# Patient Record
Sex: Female | Born: 1975 | Race: Black or African American | Hispanic: No | Marital: Single | Smoking: Never smoker
Health system: Southern US, Community
[De-identification: ages and names within clinical notes are randomized; demographics above are authoritative.]

## PROBLEM LIST (undated history)

## (undated) ENCOUNTER — Inpatient Hospital Stay (HOSPITAL_COMMUNITY): Payer: Self-pay

## (undated) DIAGNOSIS — Z8619 Personal history of other infectious and parasitic diseases: Secondary | ICD-10-CM

## (undated) DIAGNOSIS — Z789 Other specified health status: Secondary | ICD-10-CM

## (undated) DIAGNOSIS — Z87898 Personal history of other specified conditions: Secondary | ICD-10-CM

## (undated) HISTORY — DX: Personal history of other specified conditions: Z87.898

## (undated) HISTORY — DX: Personal history of other infectious and parasitic diseases: Z86.19

## (undated) HISTORY — PX: NO PAST SURGERIES: SHX2092

---

## 1997-03-06 ENCOUNTER — Ambulatory Visit (HOSPITAL_COMMUNITY): Admission: RE | Admit: 1997-03-06 | Discharge: 1997-03-06 | Payer: Self-pay | Admitting: Obstetrics

## 1997-05-23 ENCOUNTER — Other Ambulatory Visit: Admission: RE | Admit: 1997-05-23 | Discharge: 1997-05-23 | Payer: Self-pay | Admitting: Obstetrics

## 1997-06-14 ENCOUNTER — Inpatient Hospital Stay (HOSPITAL_COMMUNITY): Admission: AD | Admit: 1997-06-14 | Discharge: 1997-06-16 | Payer: Self-pay | Admitting: Obstetrics

## 1997-06-29 ENCOUNTER — Inpatient Hospital Stay (HOSPITAL_COMMUNITY): Admission: AD | Admit: 1997-06-29 | Discharge: 1997-06-29 | Payer: Self-pay | Admitting: Obstetrics

## 2000-01-21 ENCOUNTER — Inpatient Hospital Stay (HOSPITAL_COMMUNITY): Admission: AD | Admit: 2000-01-21 | Discharge: 2000-01-21 | Payer: Self-pay | Admitting: Obstetrics

## 2000-01-29 ENCOUNTER — Other Ambulatory Visit: Admission: RE | Admit: 2000-01-29 | Discharge: 2000-01-29 | Payer: Self-pay | Admitting: Obstetrics

## 2000-05-14 ENCOUNTER — Ambulatory Visit (HOSPITAL_COMMUNITY): Admission: RE | Admit: 2000-05-14 | Discharge: 2000-05-14 | Payer: Self-pay | Admitting: Neurology

## 2000-06-28 ENCOUNTER — Inpatient Hospital Stay (HOSPITAL_COMMUNITY): Admission: AD | Admit: 2000-06-28 | Discharge: 2000-06-28 | Payer: Self-pay | Admitting: Obstetrics

## 2000-08-11 ENCOUNTER — Inpatient Hospital Stay (HOSPITAL_COMMUNITY): Admission: AD | Admit: 2000-08-11 | Discharge: 2000-08-13 | Payer: Self-pay | Admitting: Obstetrics

## 2001-01-13 HISTORY — PX: BUNIONECTOMY: SHX129

## 2001-04-27 ENCOUNTER — Emergency Department (HOSPITAL_COMMUNITY): Admission: EM | Admit: 2001-04-27 | Discharge: 2001-04-27 | Payer: Self-pay | Admitting: Emergency Medicine

## 2005-05-14 ENCOUNTER — Emergency Department (HOSPITAL_COMMUNITY): Admission: EM | Admit: 2005-05-14 | Discharge: 2005-05-14 | Payer: Self-pay | Admitting: Emergency Medicine

## 2008-08-26 ENCOUNTER — Inpatient Hospital Stay (HOSPITAL_COMMUNITY): Admission: AD | Admit: 2008-08-26 | Discharge: 2008-08-26 | Payer: Self-pay | Admitting: Obstetrics

## 2010-04-20 LAB — URINALYSIS, ROUTINE W REFLEX MICROSCOPIC: Glucose, UA: NEGATIVE mg/dL

## 2010-04-20 LAB — URINE MICROSCOPIC-ADD ON

## 2010-04-20 LAB — POCT PREGNANCY, URINE: Preg Test, Ur: NEGATIVE

## 2010-04-20 LAB — WET PREP, GENITAL: Yeast Wet Prep HPF POC: NONE SEEN

## 2010-07-23 ENCOUNTER — Other Ambulatory Visit (HOSPITAL_COMMUNITY): Payer: Self-pay | Admitting: Obstetrics

## 2010-07-23 DIAGNOSIS — Z3687 Encounter for antenatal screening for uncertain dates: Secondary | ICD-10-CM

## 2010-07-25 ENCOUNTER — Ambulatory Visit (HOSPITAL_COMMUNITY)
Admission: RE | Admit: 2010-07-25 | Discharge: 2010-07-25 | Disposition: A | Discharge status: Home / Self Care | Payer: BC Managed Care – PPO | Source: Ambulatory Visit | Attending: Obstetrics | Admitting: Obstetrics

## 2010-07-25 DIAGNOSIS — R109 Unspecified abdominal pain: Secondary | ICD-10-CM | POA: Insufficient documentation

## 2010-07-25 DIAGNOSIS — O209 Hemorrhage in early pregnancy, unspecified: Secondary | ICD-10-CM | POA: Insufficient documentation

## 2010-07-25 DIAGNOSIS — Z3687 Encounter for antenatal screening for uncertain dates: Secondary | ICD-10-CM

## 2010-07-28 ENCOUNTER — Encounter (HOSPITAL_COMMUNITY): Payer: Self-pay

## 2010-07-28 ENCOUNTER — Inpatient Hospital Stay (HOSPITAL_COMMUNITY)
Admission: AD | Admit: 2010-07-28 | Discharge: 2010-07-28 | Disposition: A | Discharge status: Home / Self Care | Payer: BC Managed Care – PPO | Source: Ambulatory Visit | Attending: Obstetrics | Admitting: Obstetrics

## 2010-07-28 DIAGNOSIS — O4690 Antepartum hemorrhage, unspecified, unspecified trimester: Secondary | ICD-10-CM

## 2010-07-28 DIAGNOSIS — O209 Hemorrhage in early pregnancy, unspecified: Secondary | ICD-10-CM

## 2010-07-28 LAB — URINALYSIS, ROUTINE W REFLEX MICROSCOPIC
Glucose, UA: NEGATIVE mg/dL
Specific Gravity, Urine: 1.01 (ref 1.005–1.030)

## 2010-07-28 LAB — URINE MICROSCOPIC-ADD ON

## 2010-07-28 NOTE — Progress Notes (Signed)
Onset of bleeding x 1 week notices on tissue, first appointment with Dr. Gaynell Face on 7/21, positive UPT at home, had [place] last Thursday

## 2010-07-28 NOTE — Progress Notes (Signed)
When I go to the bathroom to urinate and I wipe I see blood.  This has been going on for about one week.  It is like a pinkish color.

## 2010-07-28 NOTE — ED Provider Notes (Signed)
History     Chief Complaint  Patient presents with  . Vaginal Bleeding   Patient is a 35 y.o. female presenting with vaginal bleeding.  Vaginal Bleeding The current episode started in the past 7 days. The problem occurs daily. The problem has been unchanged. Pertinent negatives include no abdominal pain.    OB History    Grav Para Term Preterm Abortions TAB SAB Ect Mult Living   3 2 2  0 0 0 0 0 0 2      No past medical history on file.  No past surgical history on file.  No family history on file.  History  Substance Use Topics  . Smoking status: Not on file  . Smokeless tobacco: Not on file  . Alcohol Use: Not on file    Allergies: No Known Allergies  Prescriptions prior to admission  Medication Sig Dispense Refill  . FIBER FORMULA PO Take 1 tablet by mouth daily.        . fish oil-omega-3 fatty acids 1000 MG capsule Take 1 capsule by mouth daily.        . Multiple Vitamin (MULTIVITAMIN PO) Take 1 tablet by mouth daily.          Review of Systems  Gastrointestinal: Negative for abdominal pain.  Genitourinary: Positive for vaginal bleeding.   Physical Exam   Blood pressure 108/68, pulse 72, temperature 98.5 F (36.9 C), resp. rate 18, height 5\' 5"  (1.651 m), weight 151 lb 3.2 oz (68.584 kg), last menstrual period 06/22/2010.  Physical Exam  Vitals reviewed. Constitutional: She is oriented to person, place, and time. She appears well-developed and well-nourished.  HENT:  Head: Normocephalic.  Eyes: EOM are normal.  Neck: Neck supple.  Musculoskeletal: Normal range of motion.  Neurological: She is alert and oriented to person, place, and time.  Skin: Skin is warm and dry.  Psychiatric: She has a normal mood and affect.    MAU Course  Procedures  MDM  Client continues to have bleeding which has not changed in the last week.  Has an appointment with Dr. Gaynell Face on 08-05-10.  The office ordered an ultrasound which she had on 07-25-10 but she did not get  any results from the ultrasound. Is concerned about the bleeding.  Ultrasound report reviewed.  Had gestational sac with yolk sac and fetal pole measuring <59mm  with no FHT which was normal for that size.  Also noted a moderate subchorionic hemorrhage.  Another ultrasound is not indicated at this time.  Explained to client. Advised no treatment is indicated for bleeding.  She does not know her blood type but has had 2 births and no history of having Rhogam previously.  Likely Rh positive.  No other needs at this time.  Will discharge  Assessment: Bleeding in pregnancy  Plan: Keep appointment with Dr. Gaynell Face on 08-05-10. Call your doctor if you have pain or your bleeding worsens.  Nolene Bernheim, NP 07/28/10 1645

## 2010-08-07 LAB — HIV ANTIBODY (ROUTINE TESTING W REFLEX): HIV: NONREACTIVE

## 2010-08-07 LAB — ABO/RH: RH Type: POSITIVE

## 2010-08-07 LAB — GC/CHLAMYDIA PROBE AMP, GENITAL: Gonorrhea: NEGATIVE

## 2010-09-22 ENCOUNTER — Inpatient Hospital Stay (HOSPITAL_COMMUNITY)
Admission: AD | Admit: 2010-09-22 | Discharge: 2010-09-22 | Disposition: A | Discharge status: Home / Self Care | Payer: BC Managed Care – PPO | Source: Ambulatory Visit | Attending: Obstetrics | Admitting: Obstetrics

## 2010-09-22 ENCOUNTER — Encounter (HOSPITAL_COMMUNITY): Payer: Self-pay | Admitting: *Deleted

## 2010-09-22 DIAGNOSIS — O9989 Other specified diseases and conditions complicating pregnancy, childbirth and the puerperium: Secondary | ICD-10-CM | POA: Insufficient documentation

## 2010-09-22 DIAGNOSIS — M5430 Sciatica, unspecified side: Secondary | ICD-10-CM | POA: Insufficient documentation

## 2010-09-22 DIAGNOSIS — M259 Joint disorder, unspecified: Secondary | ICD-10-CM | POA: Insufficient documentation

## 2010-09-22 DIAGNOSIS — M899 Disorder of bone, unspecified: Secondary | ICD-10-CM | POA: Insufficient documentation

## 2010-09-22 DIAGNOSIS — M5440 Lumbago with sciatica, unspecified side: Secondary | ICD-10-CM

## 2010-09-22 HISTORY — DX: Other specified health status: Z78.9

## 2010-09-22 MED ORDER — CYCLOBENZAPRINE HCL 10 MG PO TABS: 10.0000 mg | ORAL_TABLET | Freq: Two times a day (BID) | ORAL | Status: AC | PRN

## 2010-09-22 MED ORDER — ACETAMINOPHEN 325 MG PO TABS
650.0000 mg | ORAL_TABLET | Freq: Once | ORAL | Status: AC
Start: 2010-09-22 — End: 2010-09-22
  Administered 2010-09-22: 650 mg via ORAL
  Filled 2010-09-22: qty 2

## 2010-09-22 NOTE — ED Provider Notes (Addendum)
History     Chief Complaint  Patient presents with  . Leg Pain   HPI  Pt is 77w1day pregnant who presents with right hip pian radiating down her right leg.  Her pain is worse sometimes when she walks.  She doesn't have pain when sleeping ujless she turns over.  She denies abdominal pain or cramping or bleeding.  She denies UTI symptoms, constipation or diarrhea.  Past Medical History  Diagnosis Date  . No pertinent past medical history     Past Surgical History  Procedure Date  . No past surgeries     No family history on file.  History  Substance Use Topics  . Smoking status: Former Games developer  . Smokeless tobacco: Not on file  . Alcohol Use: No    Allergies: No Known Allergies  Prescriptions prior to admission  Medication Sig Dispense Refill  . FIBER FORMULA PO Take 1 tablet by mouth daily.        . fish oil-omega-3 fatty acids 1000 MG capsule Take 1 capsule by mouth daily.        . Multiple Vitamin (MULTIVITAMIN PO) Take 1 tablet by mouth daily.          Review of Systems  Constitutional: Negative.  Negative for fever and chills.  Eyes: Negative for blurred vision.  Musculoskeletal:       Right sided hip pain radiating down her leg- worse with movement  Neurological: Negative for headaches.   Physical Exam   Blood pressure 114/69, pulse 75, temperature 98.5 F (36.9 C), temperature source Oral, resp. rate 16, height 5\' 5"  (1.651 m), weight 161 lb 9.6 oz (73.301 kg), last menstrual period 06/22/2010.  Physical Exam  Vitals reviewed. Constitutional: She is oriented to person, place, and time. She appears well-developed and well-nourished. No distress.  HENT:  Head: Normocephalic.  Eyes: Pupils are equal, round, and reactive to light.  Neck: Normal range of motion.  Cardiovascular: Normal rate.   Respiratory: Effort normal.  GI: Soft. There is no tenderness.  Musculoskeletal: Normal range of motion.  Neurological: She is alert and oriented to person, place,  and time.  Skin: Skin is warm and dry.  Psychiatric: She has a normal mood and affect.    MAU Course  Procedures Will give Tylenol in MAU- since pt is driving will give prescription for Flexeril    Assessment and Plan  Pregnancy 13 w 1 day sciatica  Trecia Maring 09/22/2010, 3:56 PM

## 2010-09-22 NOTE — Progress Notes (Signed)
Onset of right leg pain buttocks pain x 3 weeks has taken Tylenol did not help.

## 2010-11-04 ENCOUNTER — Other Ambulatory Visit (HOSPITAL_COMMUNITY): Payer: Self-pay | Admitting: Obstetrics

## 2010-11-04 DIAGNOSIS — Z3689 Encounter for other specified antenatal screening: Secondary | ICD-10-CM

## 2010-11-08 ENCOUNTER — Ambulatory Visit (HOSPITAL_COMMUNITY)
Admission: RE | Admit: 2010-11-08 | Discharge: 2010-11-08 | Disposition: A | Discharge status: Home / Self Care | Payer: BC Managed Care – PPO | Source: Ambulatory Visit | Attending: Obstetrics | Admitting: Obstetrics

## 2010-11-08 DIAGNOSIS — O358XX0 Maternal care for other (suspected) fetal abnormality and damage, not applicable or unspecified: Secondary | ICD-10-CM | POA: Insufficient documentation

## 2010-11-08 DIAGNOSIS — Z3689 Encounter for other specified antenatal screening: Secondary | ICD-10-CM

## 2010-11-08 DIAGNOSIS — Z363 Encounter for antenatal screening for malformations: Secondary | ICD-10-CM | POA: Insufficient documentation

## 2010-11-08 DIAGNOSIS — Z1389 Encounter for screening for other disorder: Secondary | ICD-10-CM | POA: Insufficient documentation

## 2011-01-14 NOTE — L&D Delivery Note (Signed)
Delivery Note At 6:02 AM a viable female was delivered via Vaginal, Spontaneous Delivery (Presentation: ;  ).  APGAR: 9, 9; weight 7 lb 1.6 oz (3220 g).   Placenta status: Intact, Expressed.  Cord: 3 vessels with the following complications: None.  Cord pH: not done  Anesthesia: Epidural  Episiotomy: None Lacerations: None Suture Repair: 2.0 Est. Blood Loss (mL):   Mom to postpartum.  Baby to nursery-stable.  Sharon Atkinson A 03/22/2011, 6:25 AM

## 2011-02-13 ENCOUNTER — Encounter (HOSPITAL_COMMUNITY): Payer: Self-pay

## 2011-02-13 ENCOUNTER — Inpatient Hospital Stay (HOSPITAL_COMMUNITY)
Admission: AD | Admit: 2011-02-13 | Discharge: 2011-02-14 | Disposition: A | Discharge status: Home / Self Care | Payer: BC Managed Care – PPO | Source: Ambulatory Visit | Attending: Obstetrics | Admitting: Obstetrics

## 2011-02-13 DIAGNOSIS — R1013 Epigastric pain: Secondary | ICD-10-CM | POA: Insufficient documentation

## 2011-02-13 DIAGNOSIS — O4700 False labor before 37 completed weeks of gestation, unspecified trimester: Secondary | ICD-10-CM | POA: Insufficient documentation

## 2011-02-13 DIAGNOSIS — R109 Unspecified abdominal pain: Secondary | ICD-10-CM

## 2011-02-13 DIAGNOSIS — O26899 Other specified pregnancy related conditions, unspecified trimester: Secondary | ICD-10-CM

## 2011-02-13 LAB — COMPREHENSIVE METABOLIC PANEL
ALT: 13 U/L (ref 0–35)
AST: 18 U/L (ref 0–37)
Alkaline Phosphatase: 111 U/L (ref 39–117)
CO2: 21 mEq/L (ref 19–32)
Calcium: 8.6 mg/dL (ref 8.4–10.5)
Chloride: 100 mEq/L (ref 96–112)
GFR calc Af Amer: >90 mL/min (ref >90)
GFR calc non Af Amer: >90 mL/min (ref >90)
Glucose, Bld: 91 mg/dL (ref 70–99)
Sodium: 130 mEq/L — ABNORMAL LOW (ref 135–145)
Total Bilirubin: 1.6 mg/dL — ABNORMAL HIGH (ref 0.3–1.2)

## 2011-02-13 LAB — URINALYSIS, MICROSCOPIC ONLY
Bilirubin Urine: NEGATIVE
Hgb urine dipstick: NEGATIVE
Ketones, ur: 15 mg/dL — AB
Nitrite: NEGATIVE
Urobilinogen, UA: 1 mg/dL (ref 0.0–1.0)
pH: 5.5 (ref 5.0–8.0)

## 2011-02-13 LAB — CBC
Hemoglobin: 13 g/dL (ref 12.0–15.0)
MCH: 29.7 pg (ref 26.0–34.0)
Platelets: 218 10*3/uL (ref 150–400)
RBC: 4.38 MIL/uL (ref 3.87–5.11)
WBC: 6 10*3/uL (ref 4.0–10.5)

## 2011-02-13 MED ORDER — FAMOTIDINE IN NACL 20-0.9 MG/50ML-% IV SOLN
20.0000 mg | Freq: Once | INTRAVENOUS | Status: AC
  Administered 2011-02-13: 20 mg via INTRAVENOUS
  Filled 2011-02-13: qty 50

## 2011-02-13 MED ORDER — NIFEDIPINE 10 MG PO CAPS
10.0000 mg | ORAL_CAPSULE | Freq: Once | ORAL | Status: AC
  Administered 2011-02-13: 10 mg via ORAL
  Filled 2011-02-13: qty 1

## 2011-02-13 MED ORDER — LACTATED RINGERS IV BOLUS (SEPSIS)
1000.0000 mL | Freq: Once | INTRAVENOUS | Status: AC
  Administered 2011-02-13: 1000 mL via INTRAVENOUS

## 2011-02-13 NOTE — Progress Notes (Signed)
Patient states she started having upper abdominal pain and diarrhea this morning. No vomiting, leaking or bleeding and reports good fetal movement.

## 2011-02-13 NOTE — ED Provider Notes (Signed)
History     CSN: [id number]  Arrival date & time 02/13/11  1824   None     Chief Complaint  Patient presents with  . Abdominal Pain    HPI Sharon Atkinson is a 36 y.o. female @ [redacted]w[redacted]d gestation who presents to MAU for epigastric pain that started early today. She describes the pain as sharp and comes and goes. Denies nausea or vomiting but does have diarrhea. Last visit with Dr. Gaynell Face was yesterday. Everything was fine at that time.  Past Medical History  Diagnosis Date  . No pertinent past medical history     Past Surgical History  Procedure Date  . No past surgeries     History reviewed. No pertinent family history.  History  Substance Use Topics  . Smoking status: Former Games developer  . Smokeless tobacco: Not on file  . Alcohol Use: No    OB History    Grav Para Term Preterm Abortions TAB SAB Ect Mult Living   3 2 2  0 0 0 0 0 0 2      Review of Systems  Constitutional: Positive for appetite change. Negative for fever, chills, diaphoresis and fatigue.  HENT: Negative for ear pain, congestion, sore throat, facial swelling, neck pain, neck stiffness, dental problem and sinus pressure.   Eyes: Negative for photophobia, pain and discharge.  Respiratory: Negative for cough, chest tightness and wheezing.   Cardiovascular: Negative.   Gastrointestinal: Positive for abdominal pain and diarrhea. Negative for nausea, vomiting, constipation and abdominal distention.  Genitourinary: Negative for dysuria, frequency, flank pain, vaginal bleeding, vaginal discharge, difficulty urinating and pelvic pain.  Musculoskeletal: Negative for myalgias, back pain and gait problem.  Skin: Negative for color change and rash.  Neurological: Negative for dizziness, speech difficulty, weakness, light-headedness, numbness and headaches.  Psychiatric/Behavioral: Negative for confusion and agitation.    Allergies  Review of patient's allergies indicates no known allergies.  Home Medications  No  current outpatient prescriptions on file.  BP 121/84  Pulse 107  Temp(Src) 99.4 F (37.4 C) (Oral)  Resp 16  Ht 5' 3.75" (1.619 m)  Wt 180 lb 6.4 oz (81.829 kg)  BMI 31.21 kg/m2  SpO2 99%  LMP 06/22/2010  Physical Exam  Nursing note and vitals reviewed. Constitutional: She is oriented to person, place, and time. She appears well-developed and well-nourished.  HENT:  Head: Normocephalic.  Eyes: Pupils are equal, round, and reactive to light.  Neck: Normal range of motion. Neck supple.  Cardiovascular: Normal rate.   Pulmonary/Chest: Effort normal.  Abdominal: Soft. She exhibits no distension. There is no tenderness. There is no rebound and no guarding.       Uterine irritability noted when pt arrived then contractions became increased in frequency to 4 to 5 minutes; FHR reactive  Genitourinary:       Cervix FT thick high  Musculoskeletal: Normal range of motion.  Neurological: She is alert and oriented to person, place, and time.  Skin: Skin is warm and dry.  Psychiatric: She has a normal mood and affect.    ED Course: care turned over to Sharon Hoit, NP at 8:00 pm  Procedures Care assumed from [facility] Pt given Pepcid IV and IVF with epigastric pain resolved, however, pt continuing to be uncomfortable with contractions which have increased to every 4 minutes.   Discussed with Dr. Tamela Oddi and ordered Procardia 10 mg. Pt's contractions resolved. Urine sent for C&S since some leukocytes noted.    Pt's contractions subsided  with Procardia Pt d/c home to f/u with Dr. Gaynell Face for her OB appt [facility], NP 02/13/11 2004

## 2011-02-26 LAB — STREP B DNA PROBE: GBS: POSITIVE

## 2011-03-21 ENCOUNTER — Telehealth (HOSPITAL_COMMUNITY): Payer: Self-pay | Admitting: *Deleted

## 2011-03-21 ENCOUNTER — Encounter (HOSPITAL_COMMUNITY): Payer: Self-pay | Admitting: *Deleted

## 2011-03-21 ENCOUNTER — Encounter (HOSPITAL_COMMUNITY): Payer: Self-pay | Admitting: Obstetrics and Gynecology

## 2011-03-21 ENCOUNTER — Inpatient Hospital Stay (HOSPITAL_COMMUNITY)
Admission: AD | Admit: 2011-03-21 | Discharge: 2011-03-24 | DRG: 373 | Disposition: A | Discharge status: Home Health | Payer: BC Managed Care – PPO | Source: Ambulatory Visit | Attending: Obstetrics | Admitting: Obstetrics

## 2011-03-21 DIAGNOSIS — O99892 Other specified diseases and conditions complicating childbirth: Principal | ICD-10-CM | POA: Diagnosis present

## 2011-03-21 DIAGNOSIS — Z22330 Carrier of Group B streptococcus: Secondary | ICD-10-CM

## 2011-03-21 NOTE — Progress Notes (Signed)
"  I started having UC's at 2200 that are about 3-4 mins apart.  No bleeding or leaking of fluid.  (+) FM."

## 2011-03-21 NOTE — Telephone Encounter (Signed)
Preadmission screen  

## 2011-03-22 ENCOUNTER — Inpatient Hospital Stay (HOSPITAL_COMMUNITY): Admission: RE | Admit: 2011-03-22 | Payer: BC Managed Care – PPO | Source: Ambulatory Visit

## 2011-03-22 ENCOUNTER — Encounter (HOSPITAL_COMMUNITY): Payer: Self-pay | Admitting: Anesthesiology

## 2011-03-22 ENCOUNTER — Encounter (HOSPITAL_COMMUNITY): Payer: Self-pay | Admitting: *Deleted

## 2011-03-22 ENCOUNTER — Inpatient Hospital Stay (HOSPITAL_COMMUNITY): Payer: BC Managed Care – PPO | Admitting: Anesthesiology

## 2011-03-22 ENCOUNTER — Encounter (HOSPITAL_COMMUNITY): Payer: Self-pay

## 2011-03-22 LAB — CBC
Hemoglobin: 12.4 g/dL (ref 12.0–15.0)
MCH: 29 pg (ref 26.0–34.0)
MCHC: 34.1 g/dL (ref 30.0–36.0)
MCV: 85.2 fL (ref 78.0–100.0)
RBC: 4.27 MIL/uL (ref 3.87–5.11)

## 2011-03-22 MED ORDER — DIPHENHYDRAMINE HCL 25 MG PO CAPS: 25.0000 mg | ORAL_CAPSULE | Freq: Four times a day (QID) | ORAL | Status: DC | PRN

## 2011-03-22 MED ORDER — FERROUS SULFATE 325 (65 FE) MG PO TABS
325.0000 mg | ORAL_TABLET | Freq: Two times a day (BID) | ORAL | Status: DC
  Administered 2011-03-22 – 2011-03-24 (×5): 325 mg via ORAL
  Filled 2011-03-22 (×5): qty 1

## 2011-03-22 MED ORDER — ONDANSETRON HCL 4 MG PO TABS: 4.0000 mg | ORAL_TABLET | ORAL | Status: DC | PRN

## 2011-03-22 MED ORDER — ZOLPIDEM TARTRATE 5 MG PO TABS: 5.0000 mg | ORAL_TABLET | Freq: Every evening | ORAL | Status: DC | PRN

## 2011-03-22 MED ORDER — EPHEDRINE 5 MG/ML INJ
10.0000 mg | INTRAVENOUS | Status: DC | PRN
  Filled 2011-03-22: qty 4

## 2011-03-22 MED ORDER — PHENYLEPHRINE 40 MCG/ML (10ML) SYRINGE FOR IV PUSH (FOR BLOOD PRESSURE SUPPORT): 80.0000 ug | PREFILLED_SYRINGE | INTRAVENOUS | Status: DC | PRN

## 2011-03-22 MED ORDER — FENTANYL 2.5 MCG/ML BUPIVACAINE 1/10 % EPIDURAL INFUSION (WH - ANES)
14.0000 mL/h | INTRAMUSCULAR | Status: DC
  Administered 2011-03-22: 14 mL/h via EPIDURAL
  Filled 2011-03-22 (×2): qty 60

## 2011-03-22 MED ORDER — LACTATED RINGERS IV SOLN
INTRAVENOUS | Status: DC
  Administered 2011-03-22 (×2): via INTRAVENOUS

## 2011-03-22 MED ORDER — PRENATAL MULTIVITAMIN CH
1.0000 | ORAL_TABLET | Freq: Every day | ORAL | Status: DC
  Administered 2011-03-22 – 2011-03-24 (×3): 1 via ORAL
  Filled 2011-03-22 (×2): qty 1

## 2011-03-22 MED ORDER — SIMETHICONE 80 MG PO CHEW
80.0000 mg | CHEWABLE_TABLET | ORAL | Status: DC | PRN
  Administered 2011-03-22: 80 mg via ORAL

## 2011-03-22 MED ORDER — PENICILLIN G POTASSIUM 5000000 UNITS IJ SOLR
5.0000 10*6.[IU] | Freq: Once | INTRAVENOUS | Status: AC
  Administered 2011-03-22: 5 10*6.[IU] via INTRAVENOUS
  Filled 2011-03-22: qty 5

## 2011-03-22 MED ORDER — BUTORPHANOL TARTRATE 2 MG/ML IJ SOLN
1.0000 mg | INTRAMUSCULAR | Status: DC | PRN
  Administered 2011-03-22: 1 mg via INTRAVENOUS
  Filled 2011-03-22: qty 1

## 2011-03-22 MED ORDER — IBUPROFEN 600 MG PO TABS
600.0000 mg | ORAL_TABLET | Freq: Four times a day (QID) | ORAL | Status: DC | PRN
  Filled 2011-03-22 (×5): qty 1

## 2011-03-22 MED ORDER — WITCH HAZEL-GLYCERIN EX PADS: 1.0000 "application " | MEDICATED_PAD | CUTANEOUS | Status: DC | PRN

## 2011-03-22 MED ORDER — OXYCODONE-ACETAMINOPHEN 5-325 MG PO TABS: 1.0000 | ORAL_TABLET | ORAL | Status: DC | PRN

## 2011-03-22 MED ORDER — DEXTROSE 5 % IV SOLN
2.5000 10*6.[IU] | INTRAVENOUS | Status: DC
  Administered 2011-03-22: 2.5 10*6.[IU] via INTRAVENOUS
  Filled 2011-03-22 (×4): qty 2.5

## 2011-03-22 MED ORDER — LANOLIN HYDROUS EX OINT: TOPICAL_OINTMENT | CUTANEOUS | Status: DC | PRN

## 2011-03-22 MED ORDER — LACTATED RINGERS IV SOLN: 500.0000 mL | INTRAVENOUS | Status: DC | PRN

## 2011-03-22 MED ORDER — ONDANSETRON HCL 4 MG/2ML IJ SOLN: 4.0000 mg | Freq: Four times a day (QID) | INTRAMUSCULAR | Status: DC | PRN

## 2011-03-22 MED ORDER — FLEET ENEMA 7-19 GM/118ML RE ENEM: 1.0000 | ENEMA | RECTAL | Status: DC | PRN

## 2011-03-22 MED ORDER — FENTANYL 2.5 MCG/ML BUPIVACAINE 1/10 % EPIDURAL INFUSION (WH - ANES)
INTRAMUSCULAR | Status: DC | PRN
  Administered 2011-03-22: 14 mL/h via EPIDURAL

## 2011-03-22 MED ORDER — DIBUCAINE 1 % RE OINT: 1.0000 "application " | TOPICAL_OINTMENT | RECTAL | Status: DC | PRN

## 2011-03-22 MED ORDER — ACETAMINOPHEN 325 MG PO TABS: 650.0000 mg | ORAL_TABLET | ORAL | Status: DC | PRN

## 2011-03-22 MED ORDER — BENZOCAINE-MENTHOL 20-0.5 % EX AERO: 1.0000 "application " | INHALATION_SPRAY | CUTANEOUS | Status: DC | PRN

## 2011-03-22 MED ORDER — EPHEDRINE 5 MG/ML INJ: 10.0000 mg | INTRAVENOUS | Status: DC | PRN

## 2011-03-22 MED ORDER — OXYTOCIN BOLUS FROM INFUSION
500.0000 mL | Freq: Once | INTRAVENOUS | Status: DC
  Filled 2011-03-22: qty 500

## 2011-03-22 MED ORDER — LIDOCAINE HCL (PF) 1 % IJ SOLN
INTRAMUSCULAR | Status: DC | PRN
  Administered 2011-03-22: 4 mL
  Administered 2011-03-22: 5 mL

## 2011-03-22 MED ORDER — LIDOCAINE HCL (PF) 1 % IJ SOLN
30.0000 mL | INTRAMUSCULAR | Status: DC | PRN
  Filled 2011-03-22: qty 30

## 2011-03-22 MED ORDER — ONDANSETRON HCL 4 MG/2ML IJ SOLN: 4.0000 mg | INTRAMUSCULAR | Status: DC | PRN

## 2011-03-22 MED ORDER — CITRIC ACID-SODIUM CITRATE 334-500 MG/5ML PO SOLN: 30.0000 mL | ORAL | Status: DC | PRN

## 2011-03-22 MED ORDER — TETANUS-DIPHTH-ACELL PERTUSSIS 5-2.5-18.5 LF-MCG/0.5 IM SUSP
0.5000 mL | Freq: Once | INTRAMUSCULAR | Status: AC
  Administered 2011-03-23: 0.5 mL via INTRAMUSCULAR
  Filled 2011-03-22: qty 0.5

## 2011-03-22 MED ORDER — SENNOSIDES-DOCUSATE SODIUM 8.6-50 MG PO TABS
2.0000 | ORAL_TABLET | Freq: Every day | ORAL | Status: DC
  Administered 2011-03-22 – 2011-03-23 (×2): 2 via ORAL

## 2011-03-22 MED ORDER — DIPHENHYDRAMINE HCL 50 MG/ML IJ SOLN
12.5000 mg | INTRAMUSCULAR | Status: DC | PRN
Start: 2011-03-22 — End: 2011-03-22

## 2011-03-22 MED ORDER — OXYTOCIN 20 UNITS IN LACTATED RINGERS INFUSION - SIMPLE
125.0000 mL/h | Freq: Once | INTRAVENOUS | Status: AC
  Administered 2011-03-22: 125 mL/h via INTRAVENOUS

## 2011-03-22 MED ORDER — OXYTOCIN 20 UNITS IN LACTATED RINGERS INFUSION - SIMPLE
1.0000 m[IU]/min | INTRAVENOUS | Status: DC
Start: 2011-03-22 — End: 2011-03-22
  Administered 2011-03-22: 1 m[IU]/min via INTRAVENOUS
  Filled 2011-03-22: qty 1000

## 2011-03-22 MED ORDER — LACTATED RINGERS IV SOLN: 500.0000 mL | Freq: Once | INTRAVENOUS | Status: DC

## 2011-03-22 MED ORDER — IBUPROFEN 600 MG PO TABS
600.0000 mg | ORAL_TABLET | Freq: Four times a day (QID) | ORAL | Status: DC
  Administered 2011-03-22 – 2011-03-24 (×8): 600 mg via ORAL
  Filled 2011-03-22 (×3): qty 1

## 2011-03-22 MED ORDER — TERBUTALINE SULFATE 1 MG/ML IJ SOLN: 0.2500 mg | Freq: Once | INTRAMUSCULAR | Status: DC | PRN

## 2011-03-22 NOTE — Anesthesia Postprocedure Evaluation (Signed)
  Anesthesia Post-op Note  Patient: Sharon Atkinson  Procedure(s) Performed: * No procedures listed *  Patient Location: PACU and Mother/Baby  Anesthesia Type: Epidural  Level of Consciousness: awake, alert  and oriented  Airway and Oxygen Therapy: Patient Spontanous Breathing   Post-op Assessment: Patient's Cardiovascular Status Stable and Respiratory Function Stable  Post-op Vital Signs: stable  Complications: No apparent anesthesia complications

## 2011-03-22 NOTE — H&P (Signed)
This is Dr. Francoise Ceo dictating the history and physical on  Sharon Atkinson she is a 36 year old gravida 5 para 3013 at 39 weeks and 5 days positive GBS and received penicillin she was admitted in labor 4 cm dilated and progressed rapidly and membranes were ruptured when she was fully dilated the fluid was clear she had a normal vaginal delivery of a female Apgar 9 and 9 the placenta was spontaneous and there were no lacerations noted Past medical history negative Past surgical history negative Social history negative Physical exam well-developed female post liver HEENT negative Lungs clear Heart regular rhythm no murmurs no gallops Rest negative Uterus 20 week postpartum size Pelvic as described Extremities negative and

## 2011-03-22 NOTE — Anesthesia Procedure Notes (Signed)
Epidural Patient location during procedure: OB Start time: 03/22/2011 1:34 AM  Staffing Anesthesiologist: Kasi Lasky A. Performed by: anesthesiologist   Preanesthetic Checklist Completed: patient identified, site marked, surgical consent, pre-op evaluation, timeout performed, IV checked, risks and benefits discussed and monitors and equipment checked  Epidural Patient position: sitting Prep: site prepped and draped and DuraPrep Patient monitoring: continuous pulse ox and blood pressure Approach: midline Injection technique: LOR air  Needle:  Needle type: Tuohy  Needle gauge: 17 G Needle length: 9 cm Needle insertion depth: 5 cm cm Catheter type: closed end flexible Catheter size: 19 Gauge Catheter at skin depth: 10 cm Test dose: negative and Other  Assessment Events: blood not aspirated, injection not painful, no injection resistance, negative IV test and no paresthesia  Additional Notes Patient identified. Risks and benefits discussed including failed block, incomplete  Pain control, post dural puncture headache, nerve damage, paralysis, blood pressure Changes, nausea, vomiting, reactions to medications-both toxic and allergic and post Partum back pain. All questions were answered. Patient expressed understanding and wished to proceed. Sterile technique was used throughout procedure. Epidural site was Dressed with sterile barrier dressing. No paresthesias, signs of intravascular injection Or signs of intrathecal spread were encountered.  Patient was more comfortable after the epidural was dosed. Please see RN's note for documentation of vital signs and FHR which are stable.

## 2011-03-22 NOTE — Anesthesia Preprocedure Evaluation (Signed)
Anesthesia Evaluation  Patient identified by MRN, date of birth, ID band Patient awake    Reviewed: Allergy & Precautions, H&P , Patient's Chart, lab work & pertinent test results  Airway Mallampati: III TM Distance: >3 FB Neck ROM: full    Dental No notable dental hx. (+) Teeth Intact   Pulmonary neg pulmonary ROS,  breath sounds clear to auscultation  Pulmonary exam normal       Cardiovascular negative cardio ROS  Rhythm:regular Rate:Normal     Neuro/Psych Seizures -,  Remote Hx/o Seizures negative psych ROS   GI/Hepatic negative GI ROS, Neg liver ROS,   Endo/Other  negative endocrine ROS  Renal/GU negative Renal ROS  negative genitourinary   Musculoskeletal   Abdominal Normal abdominal exam  (+)   Peds  Hematology negative hematology ROS (+)   Anesthesia Other Findings   Reproductive/Obstetrics (+) Pregnancy                           Anesthesia Physical Anesthesia Plan  ASA: II  Anesthesia Plan: Epidural   Post-op Pain Management:    Induction:   Airway Management Planned:   Additional Equipment:   Intra-op Plan:   Post-operative Plan:   Informed Consent: I have reviewed the patients History and Physical, chart, labs and discussed the procedure including the risks, benefits and alternatives for the proposed anesthesia with the patient or authorized representative who has indicated his/her understanding and acceptance.     Plan Discussed with: Anesthesiologist and Surgeon  Anesthesia Plan Comments:         Anesthesia Quick Evaluation

## 2011-03-22 NOTE — Progress Notes (Signed)
0830 pt rec'd from L&D s/p vaginal del - report rec'd from [place], [place]

## 2011-03-23 LAB — CBC
Platelets: 201 10*3/uL (ref 150–400)
RBC: 3.78 MIL/uL — ABNORMAL LOW (ref 3.87–5.11)
RDW: 14.5 % (ref 11.5–15.5)
WBC: 7.6 10*3/uL (ref 4.0–10.5)

## 2011-03-23 NOTE — Progress Notes (Signed)
Patient ID: Sharon Atkinson, female   DOB: 07-May-1975, 36 y.o.   MRN: [id number] Postpartum day one Vital signs normal Fundus firm Lochia moderate Legs negative No complaints

## 2011-03-24 NOTE — Discharge Instructions (Signed)
Discharge instructions   You can wash your hair  Shower  Eat what you want  Drink what you want  See me in 6 weeks  Your ankles are going to swell more in the next 2 weeks than when pregnant  No sex for 6 weeks   Juleen Sorrels A, MD 03/24/2011    

## 2011-03-24 NOTE — Discharge Summary (Signed)
Obstetric Discharge Summary Reason for Admission: onset of labor Prenatal Procedures: none Intrapartum Procedures: spontaneous vaginal delivery Postpartum Procedures: none Complications-Operative and Postpartum: none Hemoglobin  Date Value Range Status  03/23/2011 11.2* 12.0-15.0 (g/dL) Final     HCT  Date Value Range Status  03/23/2011 32.8* 36.0-46.0 (%) Final    Discharge Diagnoses: Term Pregnancy-delivered  Discharge Information: Date: 03/24/2011 Activity: pelvic rest Diet: routine Medications: Percocet Condition: stable Instructions: refer to practice specific booklet Discharge to: home Follow-up Information    Follow up with Nishi Neiswonger A, MD. Call in 6 weeks.   Contact information:   [address] [place] [address] [phone]          Newborn Data: Live born female  Birth Weight: 7 lb 1.6 oz (3220 g) APGAR: 9, 9  Home with mother.  [name] A 03/24/2011, 6:45 AM

## 2011-05-17 ENCOUNTER — Encounter (HOSPITAL_COMMUNITY): Payer: Self-pay

## 2011-05-26 NOTE — H&P (Signed)
NAMEDILLIE, BURANDT                ACCOUNT NO.:  [id number]  MEDICAL RECORD NO.:  [id number]  LOCATION:  PERIO                         FACILITY:  [facility]  PHYSICIAN:  Kathreen Cosier, M.D.DATE OF BIRTH:  11/06/1975  DATE OF ADMISSION:  05/13/2011 DATE OF DISCHARGE:                             HISTORY & PHYSICAL   HISTORY OF PRESENT ILLNESS:  She is a 36 year old, gravida 4, para 3-0-1- 3 who had a 6 week checkup on 04/30/2011, desired sterilization __________ resulting in pregnancy in tube or uterus.  PAST MEDICAL HISTORY:  Negative.  PAST SURGICAL HISTORY:  Negative.  SOCIAL HISTORY:  Negative.  FAMILY HISTORY:  Negative.  SYSTEM REVIEW:  Negative.  PHYSICAL EXAMINATION:  GENERAL:  A well-developed female in no distress. HEENT:  Negative. BREASTS:  Negative. LUNGS:  Clear. HEART:  Regular rhythm.  No murmurs, no gallops. ABDOMEN:  Soft, nontender.  No masses. PELVIC:  Uterus normal size.  Negative adnexa.  Vagina and external genitalia within normal limits. EXTREMITIES:  Negative.          ______________________________ Kathreen Cosier, M.D.     BAM/MEDQ  D:  05/26/2011  T:  05/26/2011  Job:  [id number]

## 2011-05-27 ENCOUNTER — Other Ambulatory Visit: Payer: Self-pay | Admitting: Obstetrics

## 2011-05-27 ENCOUNTER — Encounter (HOSPITAL_COMMUNITY)
Admission: RE | Admit: 2011-05-27 | Discharge: 2011-05-27 | Disposition: A | Discharge status: Home / Self Care | Payer: BC Managed Care – PPO | Source: Ambulatory Visit | Attending: Obstetrics | Admitting: Obstetrics

## 2011-05-27 ENCOUNTER — Encounter (HOSPITAL_COMMUNITY): Payer: Self-pay

## 2011-05-27 ENCOUNTER — Other Ambulatory Visit (HOSPITAL_COMMUNITY): Payer: BC Managed Care – PPO

## 2011-05-27 LAB — CBC
HCT: 39.9 % (ref 36.0–46.0)
Hemoglobin: 13.1 g/dL (ref 12.0–15.0)
RBC: 4.62 MIL/uL (ref 3.87–5.11)

## 2011-05-27 LAB — SURGICAL PCR SCREEN
MRSA, PCR: NEGATIVE
Staphylococcus aureus: POSITIVE — AB

## 2011-05-27 NOTE — Patient Instructions (Addendum)
   Your procedure is scheduled on: Wednesday May 15th  Enter through the [employer] of [facility] at: 7am Pick up the phone at the desk and dial [phone] and inform us of your arrival.  Please call this number if you have any problems the morning of surgery: [phone]  Remember: Do not eat food after midnight: Tuesday  Do not drink clear liquids after: midnight Tuesday Take these medicines the morning of surgery with a SIP OF WATER: none  Do not wear jewelry, make-up, or FINGER nail polish Do not wear lotions, powders, perfumes or deodorant. Do not shave 48 hours prior to surgery. Do not bring valuables to the hospital. Contacts, dentures or bridgework may not be worn into surgery.  Leave suitcase in the car. After Surgery it may be brought to your room. For patients being admitted to the hospital, checkout time is 11:00am the day of discharge.  Patients discharged on the day of surgery will not be allowed to drive home.     Remember to use your hibiclens as instructed.Please shower with 1/2 bottle the evening before your surgery and the other 1/2 bottle the morning of surgery. Neck down avoiding private area.

## 2011-05-28 ENCOUNTER — Ambulatory Visit (HOSPITAL_COMMUNITY): Payer: BC Managed Care – PPO | Admitting: Anesthesiology

## 2011-05-28 ENCOUNTER — Encounter (HOSPITAL_COMMUNITY): Payer: Self-pay | Admitting: Anesthesiology

## 2011-05-28 ENCOUNTER — Ambulatory Visit (HOSPITAL_COMMUNITY)
Admission: RE | Admit: 2011-05-28 | Discharge: 2011-05-28 | Disposition: A | Discharge status: Home / Self Care | Payer: BC Managed Care – PPO | Source: Ambulatory Visit | Attending: Obstetrics | Admitting: Obstetrics

## 2011-05-28 ENCOUNTER — Encounter (HOSPITAL_COMMUNITY): Payer: Self-pay | Admitting: *Deleted

## 2011-05-28 ENCOUNTER — Encounter (HOSPITAL_COMMUNITY)
Admission: RE | Disposition: A | Discharge status: Home / Self Care | Payer: Self-pay | Source: Ambulatory Visit | Attending: Obstetrics

## 2011-05-28 DIAGNOSIS — Z01818 Encounter for other preprocedural examination: Secondary | ICD-10-CM | POA: Insufficient documentation

## 2011-05-28 DIAGNOSIS — Z01812 Encounter for preprocedural laboratory examination: Secondary | ICD-10-CM | POA: Insufficient documentation

## 2011-05-28 DIAGNOSIS — Z302 Encounter for sterilization: Secondary | ICD-10-CM | POA: Insufficient documentation

## 2011-05-28 DIAGNOSIS — Z641 Problems related to multiparity: Secondary | ICD-10-CM

## 2011-05-28 HISTORY — PX: LAPAROSCOPIC TUBAL LIGATION: SHX1937

## 2011-05-28 LAB — PREGNANCY, URINE: Preg Test, Ur: NEGATIVE

## 2011-05-28 SURGERY — LIGATION, FALLOPIAN TUBE, LAPAROSCOPIC
Anesthesia: General | Site: Abdomen | Laterality: Bilateral | Wound class: Clean Contaminated

## 2011-05-28 MED ORDER — KETOROLAC TROMETHAMINE 30 MG/ML IJ SOLN
INTRAMUSCULAR | Status: AC
  Filled 2011-05-28: qty 1

## 2011-05-28 MED ORDER — PROMETHAZINE HCL 25 MG/ML IJ SOLN: 6.2500 mg | INTRAMUSCULAR | Status: DC | PRN

## 2011-05-28 MED ORDER — MUPIROCIN 2 % EX OINT
TOPICAL_OINTMENT | Freq: Two times a day (BID) | CUTANEOUS | Status: DC
  Administered 2011-05-28: 1 via NASAL

## 2011-05-28 MED ORDER — DEXAMETHASONE SODIUM PHOSPHATE 4 MG/ML IJ SOLN
INTRAMUSCULAR | Status: DC | PRN
  Administered 2011-05-28: 10 mg via INTRAVENOUS

## 2011-05-28 MED ORDER — ACETAMINOPHEN 325 MG PO TABS: 325.0000 mg | ORAL_TABLET | ORAL | Status: DC | PRN

## 2011-05-28 MED ORDER — KETOROLAC TROMETHAMINE 30 MG/ML IJ SOLN: 15.0000 mg | Freq: Once | INTRAMUSCULAR | Status: DC | PRN

## 2011-05-28 MED ORDER — ONDANSETRON HCL 4 MG/2ML IJ SOLN
INTRAMUSCULAR | Status: DC | PRN
  Administered 2011-05-28: 4 mg via INTRAVENOUS

## 2011-05-28 MED ORDER — MIDAZOLAM HCL 5 MG/5ML IJ SOLN
INTRAMUSCULAR | Status: DC | PRN
  Administered 2011-05-28: 2 mg via INTRAVENOUS

## 2011-05-28 MED ORDER — LIDOCAINE HCL (CARDIAC) 20 MG/ML IV SOLN
INTRAVENOUS | Status: DC | PRN
  Administered 2011-05-28: 80 mg via INTRAVENOUS

## 2011-05-28 MED ORDER — ROCURONIUM BROMIDE 50 MG/5ML IV SOLN
INTRAVENOUS | Status: AC
  Filled 2011-05-28: qty 1

## 2011-05-28 MED ORDER — MEPERIDINE HCL 25 MG/ML IJ SOLN: 6.2500 mg | INTRAMUSCULAR | Status: DC | PRN

## 2011-05-28 MED ORDER — FENTANYL CITRATE 0.05 MG/ML IJ SOLN
INTRAMUSCULAR | Status: AC
  Filled 2011-05-28: qty 5

## 2011-05-28 MED ORDER — ONDANSETRON HCL 4 MG/2ML IJ SOLN
INTRAMUSCULAR | Status: AC
  Filled 2011-05-28: qty 2

## 2011-05-28 MED ORDER — MIDAZOLAM HCL 2 MG/2ML IJ SOLN
INTRAMUSCULAR | Status: AC
  Filled 2011-05-28: qty 2

## 2011-05-28 MED ORDER — FENTANYL CITRATE 0.05 MG/ML IJ SOLN
INTRAMUSCULAR | Status: DC | PRN
  Administered 2011-05-28: 50 ug via INTRAVENOUS
  Administered 2011-05-28: 100 ug via INTRAVENOUS
  Administered 2011-05-28: 50 ug via INTRAVENOUS

## 2011-05-28 MED ORDER — GLYCOPYRROLATE 0.2 MG/ML IJ SOLN
INTRAMUSCULAR | Status: AC
  Filled 2011-05-28: qty 1

## 2011-05-28 MED ORDER — PROPOFOL 10 MG/ML IV EMUL
INTRAVENOUS | Status: AC
  Filled 2011-05-28: qty 20

## 2011-05-28 MED ORDER — MUPIROCIN 2 % EX OINT
TOPICAL_OINTMENT | CUTANEOUS | Status: AC
  Administered 2011-05-28: 1 via NASAL
  Filled 2011-05-28: qty 22

## 2011-05-28 MED ORDER — LIDOCAINE HCL (CARDIAC) 20 MG/ML IV SOLN
INTRAVENOUS | Status: AC
  Filled 2011-05-28: qty 5

## 2011-05-28 MED ORDER — FENTANYL CITRATE 0.05 MG/ML IJ SOLN: 25.0000 ug | INTRAMUSCULAR | Status: DC | PRN

## 2011-05-28 MED ORDER — PROPOFOL 10 MG/ML IV EMUL
INTRAVENOUS | Status: DC | PRN
  Administered 2011-05-28: 180 mg via INTRAVENOUS

## 2011-05-28 MED ORDER — LACTATED RINGERS IV SOLN
INTRAVENOUS | Status: DC
  Administered 2011-05-28 (×3): via INTRAVENOUS

## 2011-05-28 MED ORDER — KETOROLAC TROMETHAMINE 30 MG/ML IJ SOLN
INTRAMUSCULAR | Status: DC | PRN
  Administered 2011-05-28: 30 mg via INTRAVENOUS

## 2011-05-28 MED ORDER — MIDAZOLAM HCL 2 MG/2ML IJ SOLN: 0.5000 mg | Freq: Once | INTRAMUSCULAR | Status: DC | PRN

## 2011-05-28 MED ORDER — DEXAMETHASONE SODIUM PHOSPHATE 10 MG/ML IJ SOLN
INTRAMUSCULAR | Status: AC
  Filled 2011-05-28: qty 1

## 2011-05-28 MED ORDER — NEOSTIGMINE METHYLSULFATE 1 MG/ML IJ SOLN
INTRAMUSCULAR | Status: AC
  Filled 2011-05-28: qty 10

## 2011-05-28 MED ORDER — MUPIROCIN 2 % EX OINT: TOPICAL_OINTMENT | Freq: Two times a day (BID) | CUTANEOUS | Status: DC

## 2011-05-28 MED ORDER — SUCCINYLCHOLINE CHLORIDE 20 MG/ML IJ SOLN
INTRAMUSCULAR | Status: DC | PRN
  Administered 2011-05-28: 80 mg via INTRAVENOUS

## 2011-05-28 SURGICAL SUPPLY — 14 items
ADH SKN CLS APL DERMABOND .7 (GAUZE/BANDAGES/DRESSINGS) ×1
CATH ROBINSON RED A/P 16FR (CATHETERS) ×2 IMPLANT
CLOTH BEACON ORANGE TIMEOUT ST (SAFETY) ×2 IMPLANT
DERMABOND ADVANCED (GAUZE/BANDAGES/DRESSINGS) ×1
DERMABOND ADVANCED .7 DNX12 (GAUZE/BANDAGES/DRESSINGS) ×1 IMPLANT
GLOVE BIO SURGEON STRL SZ8.5 (GLOVE) ×5 IMPLANT
GOWN PREVENTION PLUS LG XLONG (DISPOSABLE) ×2 IMPLANT
GOWN PREVENTION PLUS XXLARGE (GOWN DISPOSABLE) ×2 IMPLANT
PACK LAPAROSCOPY BASIN (CUSTOM PROCEDURE TRAY) ×2 IMPLANT
SUT MON AB 4-0 PS1 27 (SUTURE) ×2 IMPLANT
SUT VIC AB 0 CT1 27 (SUTURE) ×2
SUT VIC AB 0 CT1 27XBRD ANBCTR (SUTURE) ×1 IMPLANT
TOWEL OR 17X24 6PK STRL BLUE (TOWEL DISPOSABLE) ×4 IMPLANT
WATER STERILE IRR 1000ML POUR (IV SOLUTION) ×2 IMPLANT

## 2011-05-28 NOTE — Op Note (Signed)
preop diagnosis multiparity Postop diagnosis the same Anesthesia Gen. Surgeon Dr. Francoise Ceo Procedure under general anesthesia patient in the lithotomy position abdomen perineum and vagina prepped and draped data entered with a straight catheter speculum placed the vagina and the cervix grasped with a Hulka tenaculum the speculum was removed a transverse incision was made in the umbilicus carried down to the fascia fascia was cleaned grasped to cochleas and the fascia and the peritoneum opened with the Mayo scissors the sleeve of the trocar was inserted intraperitoneally and 3 L carbon dioxide and he was intraperitoneally the visit lysing scope was inserted through the sleeve of the trocar in the cautery probe inserted through the sleeve of the scope the uterus tubes and ovaries were noted to be normal the right tube was grasped 1 inch from the  cornu and cauterize the tube was cauterized a total of 4 places moving laterally from the first set of quarter the procedure was done in a similar fashion on the other side the pros were removed the CO2 allowed to escape from the peritoneal cavity the fascia closed with one stitch of 0 Dexon and the skin closed with subcuticular stitch of 4-0 Monocryl patient tolerated the procedure well taken to recovery in good condition

## 2011-05-28 NOTE — Preoperative (Signed)
Beta Blockers   Reason not to administer Beta Blockers:Not Applicable 

## 2011-05-28 NOTE — Discharge Instructions (Signed)
Laparoscopy for Tubal Ligation °Care After °Laparoscopic tubal ligation is an operation done with a long, lighted tube inserted through a small cut (incision) in the abdomen. The fallopian tubes are blocked by tying, clamping with a plastic clamp, or burning them closed with an electrocautery. °Read the instructions outlined below and refer to this sheet in the next few weeks. These instructions provide you with general information on caring for yourself after you leave the hospital. Your caregiver may also give you specific instructions. While your treatment has been planned according to the most current medical practices available, unavoidable complications may occur. If you have any problems or questions after discharge, please call your caregiver. °HOME CARE INSTRUCTIONS °· It will be normal to be sore for a couple days following surgery.  °· Take your medicine and follow the instructions from your caregiver.  °· You may resume usual diet, exercise, driving and activities as allowed by your caregiver.  °· Do not have sexual intercourse until your caregiver gives you permission.  °· Do not drive while taking pain medicine.  °· Avoid lifting until you are instructed otherwise.  °· Use showers for bathing, until you are seen by your caregiver.  °· Change dressings if needed, and as directed.  °· Only take over-the-counter or prescription medicines for pain, discomfort or fever as directed by your caregiver.  °· Do not take aspirin because it can cause bleeding.  °· Take your temperature twice a day and record it.  °· Have someone stay with you the day you have the operation, and for a couple days afterward.  °· Make an appointment to see your caregiver for stitches (sutures) or staple removal and postoperative exams, as instructed.  °SEEK MEDICAL CARE IF: °· There is redness, swelling, or increasing pain in a wound.  °· There is drainage from a wound lasting longer than one day.  °· Your pain is getting worse.   °· You develop a rash.  °· You are having a reaction to your medicine.  °· You become dizzy or lightheaded.  °· You need stronger medicine or a change in your pain medicine.  °· You notice a foul smell coming from a wound or dressing.  °· There is a breaking open of a wound after the stitches, staples, or skin adhesive strips have been removed.  °· You develop constipation.  °SEEK IMMEDIATE MEDICAL CARE IF: °· You have an oral temperature above 100.4, not controlled by medicine.  °· There is increasing abdominal pain.  °· You develop pain in your shoulders (shoulder strap areas) which becomes more severe. Some pain is common, because of the gas inserted into your abdomen during the procedure.  °· You develop bleeding or drainage from the suture sites or vagina (birth canal) following surgery.  °· You pass out.  °· You develop shortness of breath or difficulty breathing.  °· You develop chest or leg pain.  °· You develop persistent nausea, vomiting or diarrhea.  °MAKE SURE YOU:  °· Understand these instructions.  °· Watch your condition.  °· Get help right away if you are not doing well or get worse.  °Document Released: 07/19/2004 Document Re-Released: 06/19/2009 °[employer]® Patient Information ©2011 [employer].DISCHARGE INSTRUCTIONS: Laparoscopy ° °The following instructions have been prepared to help you care for yourself upon your return home today. ° °Wound care: °• Do not get the incision wet for the first 24 hours. The incision should be kept clean and dry. °• The Band-Aids or dressings may   be removed the day after surgery. °• Should the incision become sore, red, and swollen after the first week, check with your doctor. ° °Personal hygiene: °• Shower the day after your procedure. ° °Activity and limitations: °• Do NOT drive or operate any equipment today. °• Do NOT lift anything more than 15 pounds for 2-3 weeks after surgery. °• Do NOT rest in bed all day. °• Walking is encouraged. Walk each day, starting  slowly with 5-minute walks 3 or 4 times a day. Slowly increase the length of your walks. °• Walk up and down stairs slowly. °• Do NOT do strenuous activities, such as golfing, playing tennis, bowling, running, biking, weight lifting, gardening, mowing, or vacuuming for 2-4 weeks. Ask your doctor when it is okay to start. ° °Diet: Eat a light meal as desired this evening. You may resume your usual diet tomorrow. ° °Return to work: This is dependent on the type of work you do. For the most part you can return to a desk job within a week of surgery. If you are more active at work, please discuss this with your doctor. ° °What to expect after your surgery: You may have a slight burning sensation when you urinate on the first day. You may have a very small amount of blood in the urine. Expect to have a small amount of vaginal discharge/light bleeding for 1-2 weeks. It is not unusual to have abdominal soreness and bruising for up to 2 weeks. You may be tired and need more rest for about 1 week. You may experience shoulder pain for 24-72 hours. Lying flat in bed may relieve it. ° °Call your doctor for any of the following: °• Develop a fever of 100.4 or greater °• Inability to urinate 6 hours after discharge from hospital °• Severe pain not relieved by pain medications °• Persistent of heavy bleeding at incision site °• Redness or swelling around incision site after a week °• Increasing nausea or vomiting ° °Patient Signature________________________________________ °Nurse Signature_________________________________________ °

## 2011-05-28 NOTE — Transfer of Care (Signed)
Immediate Anesthesia Transfer of Care Note  Patient: Sharon Atkinson  Procedure(s) Performed: Procedure(s) (LRB): LAPAROSCOPIC TUBAL LIGATION (Bilateral)  Patient Location: PACU  Anesthesia Type: General  Level of Consciousness: awake, alert , oriented and patient cooperative  Airway & Oxygen Therapy: Patient Spontanous Breathing and Patient connected to nasal cannula oxygen  Post-op Assessment: Report given to PACU RN and Post -op Vital signs reviewed and stable  Post vital signs: Reviewed, stable  Complications: No apparent anesthesia complications

## 2011-05-28 NOTE — H&P (Signed)
  There has been no change in the patient's history and physical examination since the original dictation

## 2011-05-28 NOTE — Anesthesia Postprocedure Evaluation (Signed)
Anesthesia Post Note  Patient: Sharon Atkinson  Procedure(s) Performed: Procedure(s) (LRB): LAPAROSCOPIC TUBAL LIGATION (Bilateral)  Anesthesia type: GA  Patient location: PACU  Post pain: Pain level controlled  Post assessment: Post-op Vital signs reviewed  Last Vitals:  Filed Vitals:   05/28/11 1000  BP: 123/67  Pulse: 58  Temp:   Resp: 16    Post vital signs: Reviewed  Level of consciousness: sedated  Complications: No apparent anesthesia complications

## 2011-05-28 NOTE — Anesthesia Preprocedure Evaluation (Signed)
Anesthesia Evaluation  Patient identified by MRN, date of birth, ID band Patient awake    Reviewed: Allergy & Precautions, H&P , Patient's Chart, lab work & pertinent test results, reviewed documented beta blocker date and time   History of Anesthesia Complications Negative for: history of anesthetic complications  Airway Mallampati: II TM Distance: >3 FB Neck ROM: full    Dental No notable dental hx.    Pulmonary neg pulmonary ROS,  breath sounds clear to auscultation  Pulmonary exam normal       Cardiovascular Exercise Tolerance: Good negative cardio ROS  Rhythm:regular Rate:Normal     Neuro/Psych negative neurological ROS  negative psych ROS   GI/Hepatic negative GI ROS, Neg liver ROS,   Endo/Other  negative endocrine ROS  Renal/GU negative Renal ROS     Musculoskeletal   Abdominal   Peds  Hematology negative hematology ROS (+)   Anesthesia Other Findings No pertinent past medical history     History of chlamydia        History of seizures   as child      Reproductive/Obstetrics negative OB ROS                           Anesthesia Physical Anesthesia Plan  ASA: I  Anesthesia Plan: General ETT   Post-op Pain Management:    Induction:   Airway Management Planned:   Additional Equipment:   Intra-op Plan:   Post-operative Plan:   Informed Consent: I have reviewed the patients History and Physical, chart, labs and discussed the procedure including the risks, benefits and alternatives for the proposed anesthesia with the patient or authorized representative who has indicated his/her understanding and acceptance.   Dental Advisory Given  Plan Discussed with: CRNA and Surgeon  Anesthesia Plan Comments:         Anesthesia Quick Evaluation

## 2011-05-29 ENCOUNTER — Encounter (HOSPITAL_COMMUNITY): Payer: Self-pay | Admitting: Obstetrics

## 2013-11-14 ENCOUNTER — Encounter (HOSPITAL_COMMUNITY): Payer: Self-pay | Admitting: Obstetrics

## 2015-04-25 ENCOUNTER — Encounter (HOSPITAL_COMMUNITY): Payer: Self-pay | Admitting: Family Medicine

## 2015-04-25 ENCOUNTER — Emergency Department (HOSPITAL_COMMUNITY)
Admission: EM | Admit: 2015-04-25 | Discharge: 2015-04-25 | Disposition: A | Discharge status: Home / Self Care | Payer: BLUE CROSS/BLUE SHIELD | Attending: Emergency Medicine | Admitting: Emergency Medicine

## 2015-04-25 DIAGNOSIS — W268XXA Contact with other sharp object(s), not elsewhere classified, initial encounter: Secondary | ICD-10-CM | POA: Diagnosis not present

## 2015-04-25 DIAGNOSIS — S61214A Laceration without foreign body of right ring finger without damage to nail, initial encounter: Secondary | ICD-10-CM | POA: Insufficient documentation

## 2015-04-25 DIAGNOSIS — Y9389 Activity, other specified: Secondary | ICD-10-CM | POA: Diagnosis not present

## 2015-04-25 DIAGNOSIS — Y998 Other external cause status: Secondary | ICD-10-CM | POA: Diagnosis not present

## 2015-04-25 DIAGNOSIS — Y9289 Other specified places as the place of occurrence of the external cause: Secondary | ICD-10-CM | POA: Insufficient documentation

## 2015-04-25 DIAGNOSIS — Z8619 Personal history of other infectious and parasitic diseases: Secondary | ICD-10-CM | POA: Insufficient documentation

## 2015-04-25 DIAGNOSIS — Z23 Encounter for immunization: Secondary | ICD-10-CM | POA: Diagnosis not present

## 2015-04-25 DIAGNOSIS — S61219A Laceration without foreign body of unspecified finger without damage to nail, initial encounter: Secondary | ICD-10-CM

## 2015-04-25 MED ORDER — TETANUS-DIPHTH-ACELL PERTUSSIS 5-2.5-18.5 LF-MCG/0.5 IM SUSP
0.5000 mL | Freq: Once | INTRAMUSCULAR | Status: AC
  Administered 2015-04-25: 0.5 mL via INTRAMUSCULAR
  Filled 2015-04-25: qty 0.5

## 2015-04-25 NOTE — ED Provider Notes (Signed)
CSN: [id number]     Arrival date & time 04/25/15  1931 History  By signing my name below, I, Sharon Atkinson, attest that this documentation has been prepared under the direction and in the presence of Elpidio AnisShari Raisa Ditto, PA-C. Electronically Signed: Ronney LionSuzanne Atkinson, ED Scribe. 04/25/2015. 8:28 PM.    Chief Complaint  Patient presents with  . Extremity Laceration   The history is provided by the patient. No language interpreter was used.   HPI Comments: Hurley CiscoMiranda A Atkinson is a 40 y.o. female who presents to the Emergency Department complaining of a sudden-onset, constant, mild avulsion-type laceration to the tip of her right ring finger that onset PTA when patient was looking for keys in her pocketbook and accidentally cut her right ring finger on a razor. She states she was able to control bleeding with pressure. Patient cannot remember the date of her last tetanus vaccination. She denies a history of DM.   Past Medical History  Diagnosis Date  . No pertinent past medical history   . History of chlamydia   . History of seizures     as child   Past Surgical History  Procedure Laterality Date  . No past surgeries    . Vaginal delivery  x3  . Bunionectomy  2003  . Laparoscopic tubal ligation  05/28/2011    Procedure: LAPAROSCOPIC TUBAL LIGATION;  Surgeon: Kathreen CosierBernard A Marshall, MD;  Location: [facility];  Service: Gynecology;  Laterality: Bilateral;   Family History  Problem Relation Age of Onset  . Anesthesia problems Neg Hx    Social History  Substance Use Topics  . Smoking status: Never Smoker   . Smokeless tobacco: Never Used  . Alcohol Use: No   OB History    Gravida Para Term Preterm AB TAB SAB Ectopic Multiple Living   5 3 3  0 1 1 0 0 0 3     Review of Systems  Skin: Positive for wound.  Hematological: Does not bruise/bleed easily.   Allergies  Review of patient's allergies indicates no known allergies.  Home Medications   Prior to Admission medications   Not on File   BP 126/77 mmHg   Pulse 57  Temp(Src) 98.2 F (36.8 C) (Oral)  Resp 20  Ht 5\' 5"  (1.651 m)  Wt 167 lb (75.751 kg)  BMI 27.79 kg/m2  SpO2 100%  LMP  Physical Exam  Constitutional: She is oriented to person, place, and time. She appears well-developed and well-nourished. No distress.  HENT:  Head: Normocephalic and atraumatic.  Eyes: Conjunctivae and EOM are normal.  Neck: Neck supple. No tracheal deviation present.  Cardiovascular: Normal rate.   Pulmonary/Chest: Effort normal. No respiratory distress.  Musculoskeletal: Normal range of motion.  Neurological: She is alert and oriented to person, place, and time.  Skin: Skin is warm and dry.  Avulsion-type laceration distal right fourth finger. No active bleeding. Wound is superficial. Nail intact.  Psychiatric: She has a normal mood and affect. Her behavior is normal.  Nursing note and vitals reviewed.   ED Course  Procedures (including critical care time)  DIAGNOSTIC STUDIES: Oxygen Saturation is 100% on RA, normal by my interpretation.    COORDINATION OF CARE: 8:20 PM - Discussed treatment plan with pt at bedside which includes cleaning wound. Pt verbalized understanding and agreed to plan.   MDM   Final diagnoses:  None  1. Finger laceration   Tdap booster given.Pressure irrigation performed. Laceration not amenable to suture repair, given that it is an avulsion-type  laceration. Pt has no co morbidities to effect normal wound healing. Pt is hemodynamically stable w no complaints prior to dc.     I personally performed the services described in this documentation, which was scribed in my presence. The recorded information has been reviewed and is accurate.       Elpidio Anis, PA-C 04/25/15 2238  Arby Barrette, MD 04/26/15 0001

## 2015-04-25 NOTE — ED Notes (Signed)
Patient was looking for her keys in her pocketbook, when she a cut to her right ring finger from a razor. Bleeding controlled with slight pressure.

## 2015-04-25 NOTE — Discharge Instructions (Signed)
Nonsutured Laceration Care °A laceration is a cut that goes through all layers of the skin and extends into the tissue that is right under the skin. This type of cut is usually stitched up (sutured) or closed with tape (adhesive strips) or skin glue shortly after the injury happens. °However, if the wound is dirty or if several hours pass before medical treatment is provided, it is likely that germs (bacteria) will enter the wound. Closing a laceration after bacteria have entered it increases the risk of infection. In these cases, your health care provider may leave the laceration open (nonsutured) and cover it with a bandage. This type of treatment helps prevent infection and allows the wound to heal from the deepest layer of tissue damage up to the surface. °An open fracture is a type of injury that may involve nonsutured lacerations. An open fracture is a break in a bone that happens along with one or more lacerations through the skin that is near the fracture site. °HOW TO CARE FOR YOUR NONSUTURED LACERATION °· Take or apply over-the-counter and prescription medicines only as told by your health care provider. °· If you were prescribed an antibiotic medicine, take or apply it as told by your health care provider. Do not stop using the antibiotic even if your condition improves. °· Clean the wound one time each day or as told by your health care provider. °¨ Wash the wound with mild soap and water. °¨ Rinse the wound with water to remove all soap. °¨ Pat your wound dry with a clean towel. Do not rub the wound. °· Do not inject anything into the wound unless your health care provider told you to. °· Change any bandages (dressings) as told by your health care provider. This includes changing the dressing if it gets wet, dirty, or starts to smell bad. °· Keep the dressing dry until your health care provider says it can be removed. Do not take baths, swim, or do anything that puts your wound underwater until your  health care provider approves. °· Raise (elevate) the injured area above the level of your heart while you are sitting or lying down, if possible. °· Do not scratch or pick at the wound. °· Check your wound every day for signs of infection. Watch for: °¨ Redness, swelling, or pain. °¨ Fluid, blood, or pus. °· Keep all follow-up visits as told by your health care provider. This is important. °SEEK MEDICAL CARE IF: °· You received a tetanus and shot and you have swelling, severe pain, redness, or bleeding at the injection site.   °· You have a fever. °· Your pain is not controlled with medicine. °· You have increased redness, swelling, or pain at the site of your wound. °· You have fluid, blood, or pus coming from your wound. °· You notice a bad smell coming from your wound or your dressing. °· You notice something coming out of the wound, such as wood or glass. °· You notice a change in the color of your skin near your wound. °· You develop a new rash. °· You need to change the dressing frequently due to fluid, blood, or pus draining from the wound. °· You develop numbness around your wound. °SEEK IMMEDIATE MEDICAL CARE IF: °· Your pain suddenly increases and is severe. °· You develop severe swelling around the wound. °· The wound is on your hand or foot and you cannot properly move a finger or toe. °· The wound is on your hand or   foot and you notice that your fingers or toes look pale or bluish. °· You have a red streak going away from your wound. °  °This information is not intended to replace advice given to you by your health care provider. Make sure you discuss any questions you have with your health care provider. °  °Document Released: 11/27/2005 Document Revised: 05/16/2014 Document Reviewed: 12/26/2013 °Elsevier Interactive Patient Education ©2016 [employer] ° °

## 2015-10-02 LAB — LAB REPORT - SCANNED: PAP SMEAR: NEGATIVE

## 2015-11-15 ENCOUNTER — Encounter: Payer: Self-pay | Admitting: *Deleted

## 2017-08-19 ENCOUNTER — Other Ambulatory Visit: Payer: Self-pay | Admitting: Family Medicine

## 2017-08-19 DIAGNOSIS — Z1231 Encounter for screening mammogram for malignant neoplasm of breast: Secondary | ICD-10-CM

## 2018-08-17 ENCOUNTER — Ambulatory Visit: Payer: Self-pay | Admitting: General Surgery

## 2018-08-17 NOTE — H&P (Signed)
History of Present Illness Sharon Ruff MD; 4/0/9811 11:29 AM) The patient is a 43 year old female who presents with anal lesions. 43 year old female who presents to the office in Aug 19 for evaluation of a perianal lesion that had been present for approximately one year. She was given a course of oral antibiotics and states that this did not help her symptoms. She did not follow up with [place] in the office. She states that she continues to have significant pain in the area but denies any fevers or severe drainage. She has had this debrided twice by 2 different doctors and it continues to recur. She has a history of skin abscesses but has never had a perirectal abscess. She has no medical history and takes no medications.   Problem List/Past Medical Sharon Ruff, MD; 09/13/4780 11:30 AM) HIDRADENITIS (L73.2)  Past Surgical History Sharon Ruff, MD; 09/18/6211 11:30 AM) Cesarean Section - Multiple Foot Surgery Right.  Diagnostic Studies History Sharon Ruff, MD; 0/08/6576 11:30 AM) Colonoscopy never Mammogram never Pap Smear >5 years ago  Allergies Nance Pew, CMA; 08/17/2018 11:07 AM) No Known Drug Allergies [08/24/2017]: Allergies Reconciled  Medication History Nance Pew, CMA; 08/17/2018 11:08 AM) No Current Medications Medications Reconciled  Social History Sharon Ruff, MD; 04/19/9627 11:30 AM) Alcohol use Occasional alcohol use. No caffeine use No drug use Tobacco use Never smoker.  Family History Sharon Ruff, MD; 05/15/8411 11:30 AM) First Degree Relatives No pertinent family history  Pregnancy / Birth History Sharon Ruff, MD; 02/17/4008 11:30 AM) Age at menarche 89 years. Gravida 3 Length (months) of breastfeeding 7-12 Maternal age 54-20 Para 3 Regular periods  Other Problems Sharon Ruff, MD; 02/19/2534 11:30 AM) No pertinent past medical history    Vitals ([place]; 08/17/2018 11:08 AM) 08/17/2018 11:08 AM Weight:  172.44 lb Height: 66in Body Surface Area: 1.88 m Body Mass Index: 27.83 kg/m  Temp.: 97.67F(Temporal)  Pulse: 99 (Regular)  P.OX: 95% (Room air) BP: 142/64 (Sitting, Left Arm, Standard)        Physical Exam Sharon Ruff MD; 06/16/4032 11:30 AM)  General Mental Status-Alert. General Appearance-Not in acute distress. Build & Nutrition-Well nourished. Posture-Normal posture. Gait-Normal.  Head and Neck Head-normocephalic, atraumatic with no lesions or palpable masses. Trachea-midline.  Chest and Lung Exam Chest and lung exam reveals -on auscultation, normal breath sounds, no adventitious sounds and normal vocal resonance.  Cardiovascular Cardiovascular examination reveals -normal heart sounds, regular rate and rhythm with no murmurs and no digital clubbing, cyanosis, edema, increased warmth or tenderness.  Abdomen Inspection Inspection of the abdomen reveals - No Hernias. Palpation/Percussion Palpation and Percussion of the abdomen reveal - Soft, Non Tender, No Rigidity (guarding), No hepatosplenomegaly and No Palpable abdominal masses.  Rectal Note: Multiple pustules noted in the patient's right anterior gluteus consistent with hydradenitis. Some swelling noted.  Neurologic Neurologic evaluation reveals -alert and oriented x 3 with no impairment of recent or remote memory, normal attention span and ability to concentrate, normal sensation and normal coordination.  Musculoskeletal Normal Exam - Bilateral-Upper Extremity Strength Normal and Lower Extremity Strength Normal.    Assessment & Plan Sharon Ruff MD; 07/16/2593 11:31 AM)  HIDRADENITIS (L73.2) Impression: 43 year old female with hidradenitis. This is not improved with antibiotic therapy. We discussed excision of the area in the right anterior gluteus. We discussed that she would have an open wound that would need to be packed. We discussed that this can be painful and will  require several weeks to heal. All questions were answered. She has  decided to proceed with surgery.

## 2018-11-09 ENCOUNTER — Other Ambulatory Visit: Payer: Self-pay

## 2018-11-09 ENCOUNTER — Ambulatory Visit (HOSPITAL_COMMUNITY)
Admission: EM | Admit: 2018-11-09 | Discharge: 2018-11-09 | Disposition: A | Discharge status: Home / Self Care | Payer: BC Managed Care – PPO | Attending: Emergency Medicine | Admitting: Emergency Medicine

## 2018-11-09 ENCOUNTER — Encounter (HOSPITAL_COMMUNITY): Payer: Self-pay

## 2018-11-09 DIAGNOSIS — F419 Anxiety disorder, unspecified: Secondary | ICD-10-CM | POA: Diagnosis not present

## 2018-11-09 DIAGNOSIS — J0140 Acute pansinusitis, unspecified: Secondary | ICD-10-CM

## 2018-11-09 MED ORDER — IBUPROFEN 600 MG PO TABS: 600.0000 mg | ORAL_TABLET | Freq: Four times a day (QID) | ORAL | 0 refills | Status: AC | PRN

## 2018-11-09 MED ORDER — FLUTICASONE PROPIONATE 50 MCG/ACT NA SUSP: 2.0000 | Freq: Every day | NASAL | 0 refills | Status: AC

## 2018-11-09 MED ORDER — HYDROXYZINE HCL 50 MG PO TABS: 50.0000 mg | ORAL_TABLET | Freq: Four times a day (QID) | ORAL | 1 refills | Status: AC | PRN

## 2018-11-09 NOTE — ED Provider Notes (Signed)
HPI  SUBJECTIVE:  Sharon Atkinson is a 43 y.o. female who reports 2 days of gradual onset, intermittent maxillary sinus headache that lasts for approximately 30 seconds, accompanied with clear rhinorrhea.  She reports feeling dizzy described as being "off balance" for 2 to 3 seconds once.  Has not recurred.  She tried Tylenol 325 2 tablets once without improvement of her symptoms.  No aggravating factors.  She denies fevers, nasal congestion, postnasal drip, sore throat, cough, facial pain, upper dental pain, allergy symptoms.  No photophobia, neck stiffness, rash, slurred speech, facial droop, arm or leg weakness.  No visual changes.  No vertigo, nausea, vomiting, chest pain, shortness of breath, palpitations while feeling dizzy.  No ear pain.  No antipyretic in the past 4 to 6 hours.  She also states that her anxiety is getting worse.  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  This is a relatively new, stressful job.  She states that she is eating junk food and drinking more sodas.  She states that she has never had headaches like this before.  She has a past medical history of seizures as a baby, anxiety.  No history of diabetes, hypertension, frequent sinusitis, migraines.  LMP: 3 weeks ago.  Denies the possibility of being pregnant.  PMD: Dr. Ernie Hew    Past Medical History:  Diagnosis Date  . History of chlamydia   . History of seizures    as child  . No pertinent past medical history     Past Surgical History:  Procedure Laterality Date  . BUNIONECTOMY  2003  . LAPAROSCOPIC TUBAL LIGATION  05/28/2011   Procedure: LAPAROSCOPIC TUBAL LIGATION;  Surgeon: Frederico Hamman, MD;  Location: [facility];  Service: Gynecology;  Laterality: Bilateral;  . NO PAST SURGERIES    . VAGINAL DELIVERY  x3    Family History  Problem Relation Age of Onset  . Cancer Mother   . Cancer Father   . Anesthesia problems Neg Hx     Social History    Tobacco Use  . Smoking status: Never Smoker  . Smokeless tobacco: Never Used  Substance Use Topics  . Alcohol use: No  . Drug use: No    No current facility-administered medications for this encounter.   Current Outpatient Medications:  .  fluticasone (FLONASE) 50 MCG/ACT nasal spray, Place 2 sprays into both nostrils daily., Disp: 16 g, Rfl: 0 .  hydrOXYzine (ATARAX/VISTARIL) 50 MG tablet, Take 1 tablet (50 mg total) by mouth every 6 (six) hours as needed for anxiety. May take 2 tabs at night, Disp: 30 tablet, Rfl: 1 .  ibuprofen (ADVIL) 600 MG tablet, Take 1 tablet (600 mg total) by mouth every 6 (six) hours as needed., Disp: 30 tablet, Rfl: 0  No Known Allergies   ROS  As noted in HPI.   Physical Exam  BP 129/84 (BP Location: Left Arm)   Pulse 70   Temp 99.1 F (37.3 C) (Oral)   Resp 16   LMP 10/19/2018   SpO2 100%   Constitutional: Well developed, well nourished, no acute distress Eyes: PERRL, EOMI, conjunctiva normal bilaterally.  HENT: Normocephalic, atraumatic,mucus membranes moist.  TM normal b/l. No TMJ tenderness. Normal dentition.  Positive nasal congestion, erythematous, swollen turbinates, positive maxillary and frontal sinus tenderness.   Neck: no cervical LN + bilateral trapezial muscle tenderness. No meningismus Respiratory: normal inspiratory effort Cardiovascular: Normal rate, regular rhythm GI:  nondistended skin: No rash, skin intact Musculoskeletal: No edema, no tenderness, no deformities Neurologic: Alert & oriented x 3, CN III-XII intact, coordination grossly intact  psychiatric: Speech and behavior appropriate.  Anxious affect.   ED Course   Medications - No data to display  No orders of the defined types were placed in this encounter.  No results found for this or any previous visit (from the past 24 hour(s)). No results found.   ED Clinical Impression  1. Acute non-recurrent pansinusitis   2. Anxiety     ED  Assessment/Plan  Suspect viral sinusitis opponents musculoskeletal headache.  She has some tenderness along the trapezius.  No evidence of meningitis, stroke, other neurologic emergency.  She has no indications for antibiotics at this time.  Home with Flonase, saline nasal irrigation, Mucinex D, ibuprofen 600 mg combined with 1 g of Tylenol.  She also seems to have some anxiety, she is under significant amount of stress at her job, not sleeping much.  Will send home with Vistaril.  Work note-return on Friday  Follow-up with PMD as needed, to the ER if she gets worse discussed  MDM, plan for follow up, signs and sx that should prompt return to ER. Pt agrees with plan  Meds ordered this encounter  Medications  . fluticasone (FLONASE) 50 MCG/ACT nasal spray    Sig: Place 2 sprays into both nostrils daily.    Dispense:  16 g    Refill:  0  . ibuprofen (ADVIL) 600 MG tablet    Sig: Take 1 tablet (600 mg total) by mouth every 6 (six) hours as needed.    Dispense:  30 tablet    Refill:  0  . hydrOXYzine (ATARAX/VISTARIL) 50 MG tablet    Sig: Take 1 tablet (50 mg total) by mouth every 6 (six) hours as needed for anxiety. May take 2 tabs at night    Dispense:  30 tablet    Refill:  1    *This clinic note was created using Scientist, clinical (histocompatibility and immunogenetics). Therefore, there may be occasional mistakes despite careful proofreading.  ?   Domenick Gong, MD 11/10/18 [phone]

## 2018-11-09 NOTE — Discharge Instructions (Addendum)
Take the medication as written. Start Mucinex-D to keep the mucous thin and to decongest you.  Return to the ER if you get worse, have a fever >100.4, or for any concerns. You may take 600 mg of motrin with 1 gram of tylenol up to 3-4 times a day as needed for pain. This is an effective combination for pain.  Most sinus infections are viral and do not need antibiotics unless you have a high fever, have had this for 10 days, or you get better and then get sick again. Use a NeilMed sinus rinse as often as you want to to reduce nasal congestion. Follow the directions on the box.   The Vistaril will help with your anxiety and also help you sleep.  Push plenty of noncaffeinated, nonsugary fluids, try to eat a little more regularly.  Go to www.goodrx.com to look up your medications. This will give you a list of where you can find your prescriptions at the most affordable prices. Or you can ask the pharmacist what the cash price is. This is frequently cheaper than going through insurance.

## 2018-11-09 NOTE — ED Triage Notes (Signed)
Pt presents to UC w/ c/o sinus headache, anxiety, dizzy spells x2-3 days. Pt reports pain under eyes during palpation.
